# Patient Record
Sex: Female | Born: 1945 | Race: White | Hispanic: No | Marital: Married | Smoking: Never smoker
Health system: Southern US, Community
[De-identification: ages and names within clinical notes are randomized; demographics above are authoritative.]

## PROBLEM LIST (undated history)

## (undated) DIAGNOSIS — M48061 Spinal stenosis, lumbar region without neurogenic claudication: Secondary | ICD-10-CM

## (undated) DIAGNOSIS — M5136 Other intervertebral disc degeneration, lumbar region: Secondary | ICD-10-CM

## (undated) DIAGNOSIS — M51369 Other intervertebral disc degeneration, lumbar region without mention of lumbar back pain or lower extremity pain: Secondary | ICD-10-CM

## (undated) DIAGNOSIS — M5416 Radiculopathy, lumbar region: Secondary | ICD-10-CM

## (undated) HISTORY — PX: BREAST BIOPSY: SHX20

## (undated) HISTORY — PX: BREAST EXCISIONAL BIOPSY: SUR124

## (undated) HISTORY — PX: ABDOMINAL HYSTERECTOMY: SHX81

## (undated) HISTORY — PX: BREAST CYST ASPIRATION: SHX578

---

## 2003-05-17 HISTORY — PX: BREAST BIOPSY: SHX20

## 2004-06-26 ENCOUNTER — Ambulatory Visit: Payer: Self-pay | Admitting: Internal Medicine

## 2004-09-25 ENCOUNTER — Ambulatory Visit: Payer: Self-pay | Admitting: Internal Medicine

## 2004-10-10 ENCOUNTER — Ambulatory Visit: Payer: Self-pay | Admitting: Internal Medicine

## 2005-08-14 ENCOUNTER — Ambulatory Visit: Payer: Self-pay | Admitting: Internal Medicine

## 2006-06-05 ENCOUNTER — Ambulatory Visit: Payer: Self-pay | Admitting: Internal Medicine

## 2006-07-01 ENCOUNTER — Ambulatory Visit: Payer: Self-pay | Admitting: Internal Medicine

## 2007-07-27 ENCOUNTER — Ambulatory Visit: Payer: Self-pay | Admitting: Family Medicine

## 2008-07-27 ENCOUNTER — Ambulatory Visit: Payer: Self-pay | Admitting: Family Medicine

## 2009-07-30 ENCOUNTER — Ambulatory Visit: Payer: Self-pay | Admitting: Unknown Physician Specialty

## 2010-08-01 ENCOUNTER — Ambulatory Visit: Payer: Self-pay | Admitting: Unknown Physician Specialty

## 2011-08-05 ENCOUNTER — Ambulatory Visit: Payer: Self-pay

## 2011-09-18 ENCOUNTER — Ambulatory Visit: Payer: Self-pay | Admitting: Unknown Physician Specialty

## 2011-09-19 LAB — PATHOLOGY REPORT

## 2012-08-06 ENCOUNTER — Ambulatory Visit: Payer: Self-pay

## 2012-09-14 ENCOUNTER — Ambulatory Visit: Payer: Self-pay

## 2012-09-14 LAB — COMPREHENSIVE METABOLIC PANEL
Alkaline Phosphatase: 74 U/L (ref 50–136)
BUN: 20 mg/dL — ABNORMAL HIGH (ref 7–18)
Chloride: 96 mmol/L — ABNORMAL LOW (ref 98–107)
EGFR (African American): >60
EGFR (Non-African Amer.): >60
Glucose: 110 mg/dL — ABNORMAL HIGH (ref 65–99)
Osmolality: 271 (ref 275–301)
Potassium: 3.6 mmol/L (ref 3.5–5.1)
SGOT(AST): 19 U/L (ref 15–37)
SGPT (ALT): 31 U/L (ref 12–78)
Total Protein: 7.4 g/dL (ref 6.4–8.2)

## 2012-09-14 LAB — CBC WITH DIFFERENTIAL/PLATELET
Basophil #: 0 10*3/uL (ref 0.0–0.1)
Eosinophil #: 0 10*3/uL (ref 0.0–0.7)
HCT: 42.1 % (ref 35.0–47.0)
HGB: 14.4 g/dL (ref 12.0–16.0)
Lymphocyte #: 0.2 10*3/uL — ABNORMAL LOW (ref 1.0–3.6)
Lymphocyte %: 3 %
MCH: 31.5 pg (ref 26.0–34.0)
MCV: 92 fL (ref 80–100)
Monocyte #: 0.2 x10 3/mm (ref 0.2–0.9)
Neutrophil %: 93.8 %
Platelet: 259 10*3/uL (ref 150–440)
RBC: 4.58 10*6/uL (ref 3.80–5.20)

## 2012-09-14 LAB — URINALYSIS, COMPLETE
Ketone: NEGATIVE
Ph: 6 (ref 4.5–8.0)
Specific Gravity: 1.025 (ref 1.003–1.030)

## 2012-11-16 ENCOUNTER — Ambulatory Visit: Payer: Self-pay | Admitting: Unknown Physician Specialty

## 2013-09-07 ENCOUNTER — Ambulatory Visit: Payer: Self-pay | Admitting: Family Medicine

## 2014-09-12 ENCOUNTER — Other Ambulatory Visit: Payer: Self-pay | Admitting: Family Medicine

## 2014-09-12 DIAGNOSIS — Z1231 Encounter for screening mammogram for malignant neoplasm of breast: Secondary | ICD-10-CM

## 2014-09-27 ENCOUNTER — Ambulatory Visit
Admission: RE | Admit: 2014-09-27 | Discharge: 2014-09-27 | Disposition: A | Discharge status: Home / Self Care | Payer: Medicare Other | Source: Ambulatory Visit | Attending: Family Medicine | Admitting: Family Medicine

## 2014-09-27 DIAGNOSIS — Z1231 Encounter for screening mammogram for malignant neoplasm of breast: Secondary | ICD-10-CM | POA: Insufficient documentation

## 2015-01-27 ENCOUNTER — Encounter: Payer: Self-pay | Admitting: *Deleted

## 2015-01-27 ENCOUNTER — Ambulatory Visit
Admission: EM | Admit: 2015-01-27 | Discharge: 2015-01-27 | Disposition: A | Discharge status: Home / Self Care | Payer: Medicare Other | Attending: Family Medicine | Admitting: Family Medicine

## 2015-01-27 DIAGNOSIS — M545 Low back pain: Secondary | ICD-10-CM | POA: Insufficient documentation

## 2015-01-27 DIAGNOSIS — Z79899 Other long term (current) drug therapy: Secondary | ICD-10-CM | POA: Diagnosis not present

## 2015-01-27 DIAGNOSIS — M62830 Muscle spasm of back: Secondary | ICD-10-CM | POA: Diagnosis not present

## 2015-01-27 DIAGNOSIS — G8929 Other chronic pain: Secondary | ICD-10-CM | POA: Insufficient documentation

## 2015-01-27 DIAGNOSIS — M5136 Other intervertebral disc degeneration, lumbar region: Secondary | ICD-10-CM | POA: Diagnosis not present

## 2015-01-27 DIAGNOSIS — M4806 Spinal stenosis, lumbar region: Secondary | ICD-10-CM | POA: Diagnosis not present

## 2015-01-27 DIAGNOSIS — M5416 Radiculopathy, lumbar region: Secondary | ICD-10-CM | POA: Insufficient documentation

## 2015-01-27 DIAGNOSIS — Z7982 Long term (current) use of aspirin: Secondary | ICD-10-CM | POA: Insufficient documentation

## 2015-01-27 HISTORY — DX: Radiculopathy, lumbar region: M54.16

## 2015-01-27 HISTORY — DX: Other intervertebral disc degeneration, lumbar region without mention of lumbar back pain or lower extremity pain: M51.369

## 2015-01-27 HISTORY — DX: Spinal stenosis, lumbar region without neurogenic claudication: M48.061

## 2015-01-27 HISTORY — DX: Other intervertebral disc degeneration, lumbar region: M51.36

## 2015-01-27 LAB — URINALYSIS COMPLETE WITH MICROSCOPIC (ARMC ONLY)
Bacteria, UA: NONE SEEN — AB
Bilirubin Urine: NEGATIVE
Glucose, UA: NEGATIVE mg/dL
Hgb urine dipstick: NEGATIVE
Ketones, ur: NEGATIVE mg/dL
Leukocytes, UA: NEGATIVE
Nitrite: NEGATIVE
Protein, ur: NEGATIVE mg/dL
Specific Gravity, Urine: 1.015 (ref 1.005–1.030)
WBC, UA: NONE SEEN WBC/hpf (ref <3)
pH: 7 (ref 5.0–8.0)

## 2015-01-27 MED ORDER — MELOXICAM 7.5 MG PO TABS: 7.5000 mg | ORAL_TABLET | Freq: Every day | ORAL | Status: AC

## 2015-01-27 MED ORDER — METAXALONE 800 MG PO TABS: 800.0000 mg | ORAL_TABLET | Freq: Three times a day (TID) | ORAL | Status: AC

## 2015-01-27 MED ORDER — KETOROLAC TROMETHAMINE 60 MG/2ML IM SOLN
30.0000 mg | Freq: Once | INTRAMUSCULAR | Status: AC
  Administered 2015-01-27: 30 mg via INTRAMUSCULAR

## 2015-01-27 NOTE — ED Provider Notes (Signed)
CSN: [id number]     Arrival date & time 01/27/15  4098 History   First MD Initiated Contact with Patient 01/27/15 1030     Chief Complaint  Patient presents with  . Back Pain   (Consider location/radiation/quality/duration/timing/severity/associated sxs/prior Treatment) HPI   This 69 year old female who presents with chronic low back pain she normally feels in the lower segments. She has had several epidural steroid injections which provide her with a great deal of relief from 4 months up to 8 months. She states that she knows that she needs to have surgery in the future but is waiting, at her surgeon's insistence, until she cannot stand it any longer. The last couple of days she has noticed the onset of left-sided low back pain which is more intense and different location than what she normally experiences. This is more towards the flank does not radiate when she will normally get on occasion down her left leg. Last night it was so severe that she decided to come in today. She has an appointment with Dr. Yves Dill next week but decided she can't wait. She does have a prescription for hydrocodone that she takes once nightly does help her sleep. He denies any urinary symptoms.  Past Medical History  Diagnosis Date  . Lumbar radiculitis   . Lumbar stenosis without neurogenic claudication   . Degenerative disc disease, lumbar    Past Surgical History  Procedure Laterality Date  . Breast biopsy Left     neg  . Breast biopsy Right 05/17/03    neg  . Breast cyst aspiration      not sure of side 1998-neg  . Abdominal hysterectomy     Family History  Problem Relation Age of Onset  . Breast cancer Sister 47  . Breast cancer Paternal Aunt    Social History  Substance Use Topics  . Smoking status: Never Smoker   . Smokeless tobacco: None  . Alcohol Use: No   OB History    No data available     Review of Systems  Constitutional: Negative for fever, chills, fatigue and unexpected weight  change.  Musculoskeletal: Positive for myalgias and back pain.  All other systems reviewed and are negative.   Allergies  Levaquin and Tramadol  Home Medications   Prior to Admission medications   Medication Sig Start Date End Date Taking? Authorizing Provider  amLODipine (NORVASC) 5 MG tablet Take 5 mg by mouth daily.   Yes Historical Provider, MD  aspirin 81 MG tablet Take 81 mg by mouth daily.   Yes Historical Provider, MD  Calcium Carbonate (CALCIUM 600 PO) Take by mouth.   Yes Historical Provider, MD  Cholecalciferol (VITAMIN D3) 1000 UNITS CAPS Take by mouth.   Yes Historical Provider, MD  fluticasone (VERAMYST) 27.5 MCG/SPRAY nasal spray Place 2 sprays into the nose daily.   Yes Historical Provider, MD  HYDROcodone-acetaminophen (NORCO/VICODIN) 5-325 MG per tablet Take 1 tablet by mouth daily.   Yes Historical Provider, MD  ibuprofen (ADVIL,MOTRIN) 800 MG tablet Take 800 mg by mouth every 8 (eight) hours as needed.   Yes Historical Provider, MD  lovastatin (MEVACOR) 10 MG tablet Take 10 mg by mouth at bedtime.   Yes Historical Provider, MD  Multiple Vitamin (MULTIVITAMIN) capsule Take 1 capsule by mouth daily.   Yes Historical Provider, MD  Omega-3 Fatty Acids (OMEGA 3 PO) Take by mouth.   Yes Historical Provider, MD  meloxicam (MOBIC) 7.5 MG tablet Take 1 tablet (7.5 mg total) by  mouth daily. 01/27/15   Lutricia Feil, PA-C  metaxalone (SKELAXIN) 800 MG tablet Take 1 tablet (800 mg total) by mouth 3 (three) times daily. 01/27/15   Lutricia Feil, PA-C   Meds Ordered and Administered this Visit   Medications  ketorolac (TORADOL) injection 30 mg (30 mg Intramuscular Given 01/27/15 1136)    BP 137/83 mmHg  Pulse 83  Temp(Src) 97.9 F (36.6 C) (Oral)  Ht  (1.651 m)  Wt 170 lb (77.111 kg)  BMI 28.29 kg/m2  SpO2 97% No data found.   Physical Exam  Constitutional: She is oriented to person, place, and time. She appears well-developed and well-nourished. No distress.   HENT:  Head: Normocephalic and atraumatic.  Eyes: Pupils are equal, round, and reactive to light.  Neck: Normal range of motion. Neck supple.  Musculoskeletal:  Examination of the back was on the presence of Erin, nursing student who acted as Biomedical engineer. Examination of the lumbar spine shows a level pelvis in stance. Forward flexion extension and bilateral lateral flexion is comfortable. thoracic rotation particular to the left reproduces her pain. Is no radicular symptoms elicited. She is able to toe and heel walk. Sensation distally was intact to light touch. DTRs are 3+ over 4 and bilaterally symmetrical. Straight leg raise testing is negative at 90 in the sitting position bilaterally. With the patient prone deep palpation in the left paraspinous muscles from T11-L1 reproduces her symptoms. Right paraspinous muscles are nontender and soft. The patient in the right does not reproduce symptoms. She has been a negative CVA tenderness. Urinalysis was benign with no red or white cells present.  Neurological: She is alert and oriented to person, place, and time.  Skin: Skin is warm and dry. She is not diaphoretic.  Psychiatric: She has a normal mood and affect. Her behavior is normal. Judgment and thought content normal.  Nursing note and vitals reviewed.   ED Course  Procedures (including critical care time)  Labs Review Labs Reviewed  URINALYSIS COMPLETEWITH MICROSCOPIC ([facility] ONLY) - Abnormal; Notable for the following:    Bacteria, UA NONE SEEN (*)    Squamous Epithelial / LPF 0-5 (*)    All other components within normal limits    Imaging Review No results found.   Visual Acuity Review  Right Eye Distance:   Left Eye Distance:   Bilateral Distance:    Right Eye Near:   Left Eye Near:    Bilateral Near:     Medications  ketorolac (TORADOL) injection 30 mg (30 mg Intramuscular Given 01/27/15 1136)      MDM   1. Spasm of thoracolumbar muscle    New Prescriptions    MELOXICAM (MOBIC) 7.5 MG TABLET    Take 1 tablet (7.5 mg total) by mouth daily.   METAXALONE (SKELAXIN) 800 MG TABLET    Take 1 tablet (800 mg total) by mouth 3 (three) times daily.  Plan: 1. Test/x-ray results and diagnosis reviewed with patient 2. rx as per orders; risks, benefits, potential side effects reviewed with patient 3. Recommend supportive treatment with rest,symptom avoidance. Cautioned re: dizziness with use of muscle relaxers.   4. F/uwith PCP or spinal surgeon.    Lutricia Feil, PA-C 01/27/15 1143

## 2015-01-27 NOTE — ED Notes (Signed)
Pt states that she has had left sided back pain on and off for about 2 years, she has received 2 epidural steroid injections, last one in April, Pt is scheduled to see Dr Yves Dill in 2 weeks.  Pain is too bad to wait for the upcoming appointment

## 2015-10-17 ENCOUNTER — Other Ambulatory Visit: Payer: Self-pay | Admitting: Family Medicine

## 2015-10-17 DIAGNOSIS — Z1231 Encounter for screening mammogram for malignant neoplasm of breast: Secondary | ICD-10-CM

## 2015-10-23 ENCOUNTER — Ambulatory Visit
Admission: RE | Admit: 2015-10-23 | Discharge: 2015-10-23 | Disposition: A | Discharge status: Home / Self Care | Payer: Medicare Other | Source: Ambulatory Visit | Attending: Family Medicine | Admitting: Family Medicine

## 2015-10-23 DIAGNOSIS — Z1231 Encounter for screening mammogram for malignant neoplasm of breast: Secondary | ICD-10-CM | POA: Insufficient documentation

## 2016-09-15 ENCOUNTER — Other Ambulatory Visit: Payer: Self-pay | Admitting: Family Medicine

## 2016-09-15 DIAGNOSIS — Z1231 Encounter for screening mammogram for malignant neoplasm of breast: Secondary | ICD-10-CM

## 2016-10-23 ENCOUNTER — Other Ambulatory Visit: Payer: Self-pay | Admitting: Family Medicine

## 2016-10-23 ENCOUNTER — Ambulatory Visit
Admission: RE | Admit: 2016-10-23 | Discharge: 2016-10-23 | Disposition: A | Discharge status: Home / Self Care | Payer: Medicare Other | Source: Ambulatory Visit | Attending: Family Medicine | Admitting: Family Medicine

## 2016-10-23 DIAGNOSIS — Z1231 Encounter for screening mammogram for malignant neoplasm of breast: Secondary | ICD-10-CM

## 2017-10-02 ENCOUNTER — Other Ambulatory Visit: Payer: Self-pay | Admitting: Nurse Practitioner

## 2017-10-02 DIAGNOSIS — Z1231 Encounter for screening mammogram for malignant neoplasm of breast: Secondary | ICD-10-CM

## 2017-10-26 ENCOUNTER — Ambulatory Visit: Payer: Medicare Other

## 2018-01-12 ENCOUNTER — Ambulatory Visit
Admission: RE | Admit: 2018-01-12 | Discharge: 2018-01-12 | Disposition: A | Discharge status: Home / Self Care | Payer: Medicare Other | Source: Ambulatory Visit | Attending: Nurse Practitioner | Admitting: Nurse Practitioner

## 2018-01-12 DIAGNOSIS — Z1231 Encounter for screening mammogram for malignant neoplasm of breast: Secondary | ICD-10-CM | POA: Diagnosis present

## 2018-07-21 ENCOUNTER — Other Ambulatory Visit: Payer: Self-pay

## 2018-07-21 ENCOUNTER — Encounter: Payer: Self-pay | Admitting: Emergency Medicine

## 2018-07-21 ENCOUNTER — Ambulatory Visit
Admission: EM | Admit: 2018-07-21 | Discharge: 2018-07-21 | Disposition: A | Discharge status: Home / Self Care | Payer: Medicare Other | Attending: Family Medicine | Admitting: Family Medicine

## 2018-07-21 DIAGNOSIS — S0991XA Unspecified injury of ear, initial encounter: Secondary | ICD-10-CM | POA: Diagnosis not present

## 2018-07-21 DIAGNOSIS — X58XXXA Exposure to other specified factors, initial encounter: Secondary | ICD-10-CM | POA: Diagnosis not present

## 2018-07-21 NOTE — ED Provider Notes (Signed)
[facility]    CSN: [id number] Arrival date & time: 07/21/18  1910     History   Chief Complaint Chief Complaint  Patient presents with  . Q-tip in left ear   HPI  73 year old female presents with concern for foreign body in her left ear.  Patient states that she was cleaning her ears today.  She is active in her ear and he noted that he did not have the cotton tip on it.  She is concerned that she has the cotton stuck in her left ear.  No reports of drainage from the ear.  No reports of hearing loss.  No other reported symptoms. No other complaints.   PMH, Surgical Hx, Family Hx, Social History reviewed and updated as below.  Past Medical History:  Diagnosis Date  . Degenerative disc disease, lumbar   . Lumbar radiculitis   . Lumbar stenosis without neurogenic claudication    Past Surgical History:  Procedure Laterality Date  . ABDOMINAL HYSTERECTOMY    . BREAST BIOPSY Left    neg  . BREAST BIOPSY Right 05/17/03   neg  . BREAST CYST ASPIRATION Left    1998-neg   OB History   No obstetric history on file.    Home Medications    Prior to Admission medications   Medication Sig Start Date End Date Taking? Authorizing Provider  amLODipine (NORVASC) 5 MG tablet Take 5 mg by mouth daily.   Yes [provider]  aspirin 81 MG tablet Take 81 mg by mouth daily.   Yes [provider]  Calcium Carbonate (CALCIUM 600 PO) Take by mouth.   Yes [provider]  Cetirizine HCl 10 MG CAPS Take by mouth.   Yes [provider]  Cholecalciferol (VITAMIN D3) 1000 UNITS CAPS Take by mouth.   Yes [provider]  docusate sodium (COLACE) 100 MG capsule Take by mouth.   Yes [provider]  fluticasone (VERAMYST) 27.5 MCG/SPRAY nasal spray Place 2 sprays into the nose daily.   Yes [provider]  HYDROcodone-acetaminophen (NORCO/VICODIN) 5-325 MG per tablet Take 1 tablet by mouth daily.   Yes [provider]  ibuprofen (ADVIL,MOTRIN) 800 MG tablet Take 800 mg by mouth every 8 (eight) hours as needed.   Yes [provider]  lovastatin (MEVACOR) 10 MG tablet Take 10 mg by mouth at bedtime.   Yes [provider]  Melatonin 10 MG CAPS Take by mouth.   Yes [provider]  Multiple Vitamin (MULTIVITAMIN) capsule Take 1 capsule by mouth daily.   Yes [provider]  Omega-3 Fatty Acids (OMEGA 3 PO) Take by mouth.   Yes [provider]  traZODone (DESYREL) 100 MG tablet Take by mouth. 06/14/18  Yes [provider]  meloxicam (MOBIC) 7.5 MG tablet Take 1 tablet (7.5 mg total) by mouth daily. 01/27/15   Lutricia Feil, PA-C  metaxalone (SKELAXIN) 800 MG tablet Take 1 tablet (800 mg total) by mouth 3 (three) times daily. 01/27/15   Lutricia Feil, PA-C    Family History Family History  Problem Relation Age of Onset  . Breast cancer Sister 89  . Breast cancer Paternal Aunt   . Breast cancer Paternal Aunt   . Dementia Mother   . Hypertension Mother   . Heart attack Father 72    Social History Social History   Tobacco Use  . Smoking status: Never Smoker  . Smokeless tobacco: Never Used  Substance Use Topics  .  Alcohol use: No  . Drug use: No     Allergies   Levaquin [levofloxacin] and Tramadol   Review of Systems Review of Systems  Constitutional: Negative.   HENT:       Concern for foreign body in left ear.   Physical Exam Triage Vital Signs ED Triage Vitals  Enc Vitals Group     BP 07/21/18 1944 (!) 165/89     Pulse Rate 07/21/18 1944 73     Resp 07/21/18 1944 16     Temp 07/21/18 1944 98.3 F (36.8 C)     Temp Source 07/21/18 1944 Oral     SpO2 07/21/18 1944 97 %     Weight 07/21/18 1943 150 lb (68 kg)     Height 07/21/18 1943  (1.651 m)     Head Circumference --      Peak Flow --      Pain Score 07/21/18 1943 0     Pain Loc --      Pain Edu? --      Excl. in GC? --    Updated Vital Signs BP (!) 165/89  (BP Location: Left Arm)   Pulse 73   Temp 98.3 F (36.8 C) (Oral)   Resp 16   Ht  (1.651 m)   Wt 68 kg   SpO2 97%   BMI 24.96 kg/m   Visual Acuity Right Eye Distance:   Left Eye Distance:   Bilateral Distance:    Right Eye Near:   Left Eye Near:    Bilateral Near:     Physical Exam Vitals signs and nursing note reviewed.  Constitutional:      General: She is not in acute distress.    Appearance: Normal appearance.  HENT:     Head: Normocephalic and atraumatic.     Ears:     Comments: Left ear - No foreign body. Area of blood noted from injury due to Q Tip. Eyes:     General:        Right eye: No discharge.        Left eye: No discharge.     Conjunctiva/sclera: Conjunctivae normal.  Pulmonary:     Effort: Pulmonary effort is normal. No respiratory distress.  Neurological:     Mental Status: She is alert.  Psychiatric:        Mood and Affect: Mood normal.        Behavior: Behavior normal.    UC Treatments / Results  Labs (all labs ordered are listed, but only abnormal results are displayed) Labs Reviewed - No data to display  EKG None  Radiology No results found.  Procedures Procedures (including critical care time)  Medications Ordered in UC Medications - No data to display  Initial Impression / Assessment and Plan / UC Course  I have reviewed the triage vital signs and the nursing notes.  Pertinent labs & imaging results that were available during my care of the patient were reviewed by me and considered in my medical decision making (see chart for details).    73 year old female presents with a concern for foreign body in her left ear.  There was evidence of mild injury due to use of Q-tip.  No foreign body appreciated.  Advised supportive care.  Advised to discontinue use of Q-tips.  Final Clinical Impressions(s) / UC Diagnoses   Final diagnoses:  Injury of ear canal, initial encounter   Discharge Instructions   None    ED  Prescriptions    None     Controlled Substance Prescriptions [place] Controlled Substance Registry consulted? Not Applicable   Tommie Sams, DO 07/21/18 2052

## 2018-07-21 NOTE — ED Triage Notes (Signed)
Patient in today stating that she put a Q-tip in her left ear tonight and when she removed it the cotton was not on the tip.

## 2018-12-13 ENCOUNTER — Other Ambulatory Visit: Payer: Self-pay | Admitting: Nurse Practitioner

## 2018-12-13 DIAGNOSIS — Z1231 Encounter for screening mammogram for malignant neoplasm of breast: Secondary | ICD-10-CM

## 2018-12-20 ENCOUNTER — Other Ambulatory Visit: Payer: Self-pay | Admitting: Nurse Practitioner

## 2018-12-20 ENCOUNTER — Other Ambulatory Visit: Payer: Self-pay | Admitting: Family Medicine

## 2018-12-20 DIAGNOSIS — Z1231 Encounter for screening mammogram for malignant neoplasm of breast: Secondary | ICD-10-CM

## 2019-01-18 ENCOUNTER — Other Ambulatory Visit: Payer: Self-pay

## 2019-01-18 ENCOUNTER — Ambulatory Visit
Admission: RE | Admit: 2019-01-18 | Discharge: 2019-01-18 | Disposition: A | Discharge status: Home / Self Care | Payer: Medicare Other | Source: Ambulatory Visit | Attending: Nurse Practitioner | Admitting: Nurse Practitioner

## 2019-01-18 DIAGNOSIS — Z1231 Encounter for screening mammogram for malignant neoplasm of breast: Secondary | ICD-10-CM | POA: Diagnosis not present

## 2020-01-18 ENCOUNTER — Other Ambulatory Visit: Payer: Self-pay | Admitting: Nurse Practitioner

## 2020-01-18 DIAGNOSIS — Z1231 Encounter for screening mammogram for malignant neoplasm of breast: Secondary | ICD-10-CM

## 2020-02-01 ENCOUNTER — Ambulatory Visit
Admission: RE | Admit: 2020-02-01 | Discharge: 2020-02-01 | Disposition: A | Discharge status: Home / Self Care | Payer: Medicare Other | Source: Ambulatory Visit | Attending: Nurse Practitioner | Admitting: Nurse Practitioner

## 2020-02-01 ENCOUNTER — Other Ambulatory Visit: Payer: Self-pay

## 2020-02-01 DIAGNOSIS — Z1231 Encounter for screening mammogram for malignant neoplasm of breast: Secondary | ICD-10-CM | POA: Diagnosis not present

## 2021-01-28 ENCOUNTER — Other Ambulatory Visit: Payer: Self-pay | Admitting: Nurse Practitioner

## 2021-01-28 DIAGNOSIS — Z1231 Encounter for screening mammogram for malignant neoplasm of breast: Secondary | ICD-10-CM

## 2021-02-13 ENCOUNTER — Ambulatory Visit
Admission: RE | Admit: 2021-02-13 | Discharge: 2021-02-13 | Disposition: A | Discharge status: Home / Self Care | Payer: Medicare Other | Source: Ambulatory Visit | Attending: Nurse Practitioner | Admitting: Nurse Practitioner

## 2021-02-13 ENCOUNTER — Other Ambulatory Visit: Payer: Self-pay

## 2021-02-13 DIAGNOSIS — Z1231 Encounter for screening mammogram for malignant neoplasm of breast: Secondary | ICD-10-CM | POA: Insufficient documentation

## 2021-11-30 IMAGING — MG MM DIGITAL SCREENING BILAT W/ TOMO AND CAD
8 series · 8 of 24 positions shown · non-contrast
Comparison: Previous exam(s).

CLINICAL DATA: Screening.

EXAM:
DIGITAL SCREENING BILATERAL MAMMOGRAM WITH TOMOSYNTHESIS AND CAD
TECHNIQUE: Bilateral screening digital craniocaudal and mediolateral oblique
mammograms were obtained. Bilateral screening digital breast
tomosynthesis was performed. The images were evaluated with
computer-aided detection.

[R CC synth-2D]
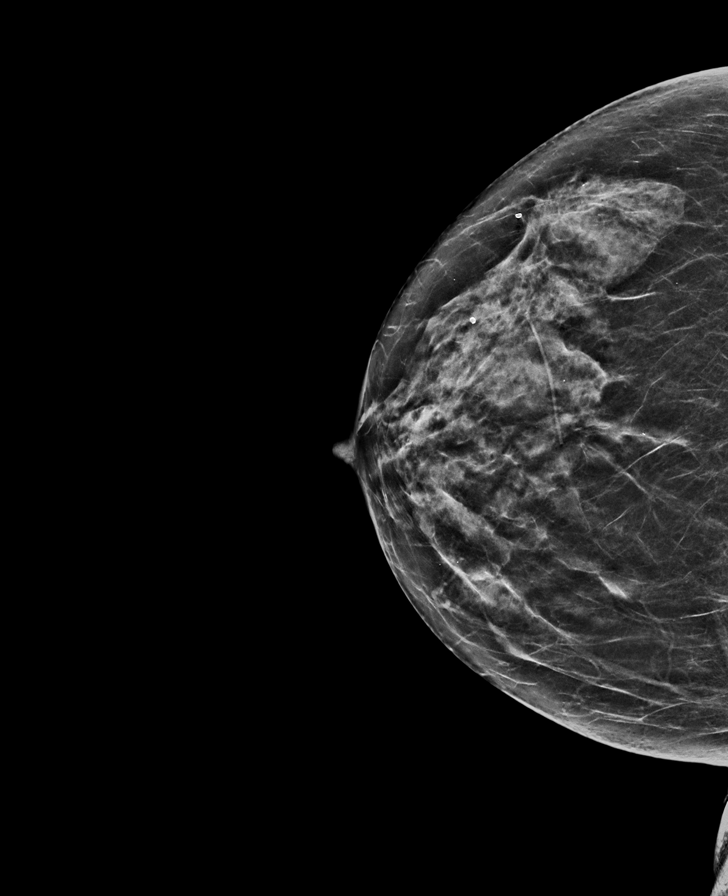

[R MLO synth-2D]
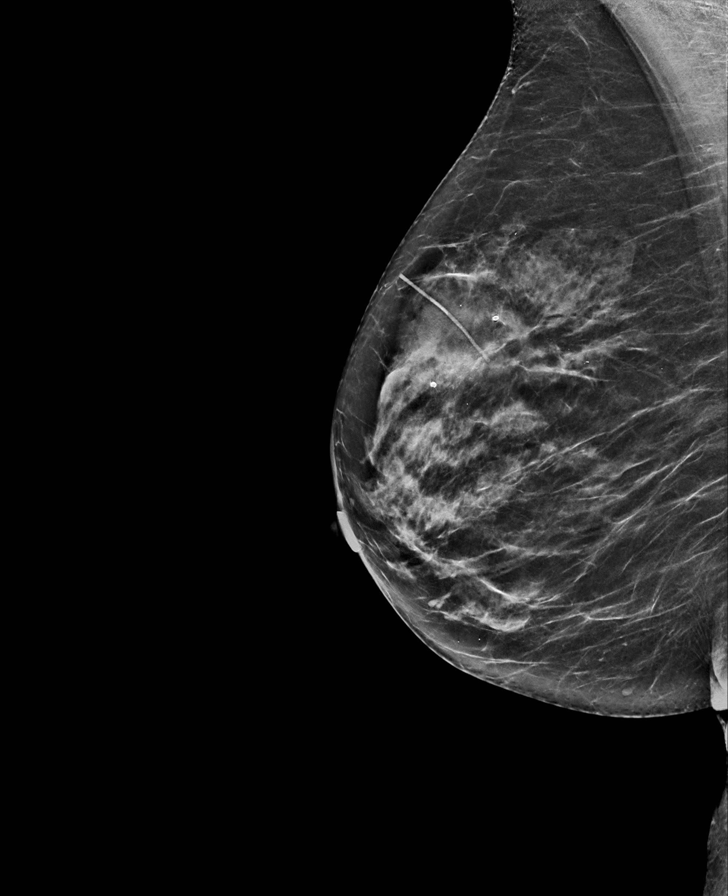

[L MLO synth-2D]
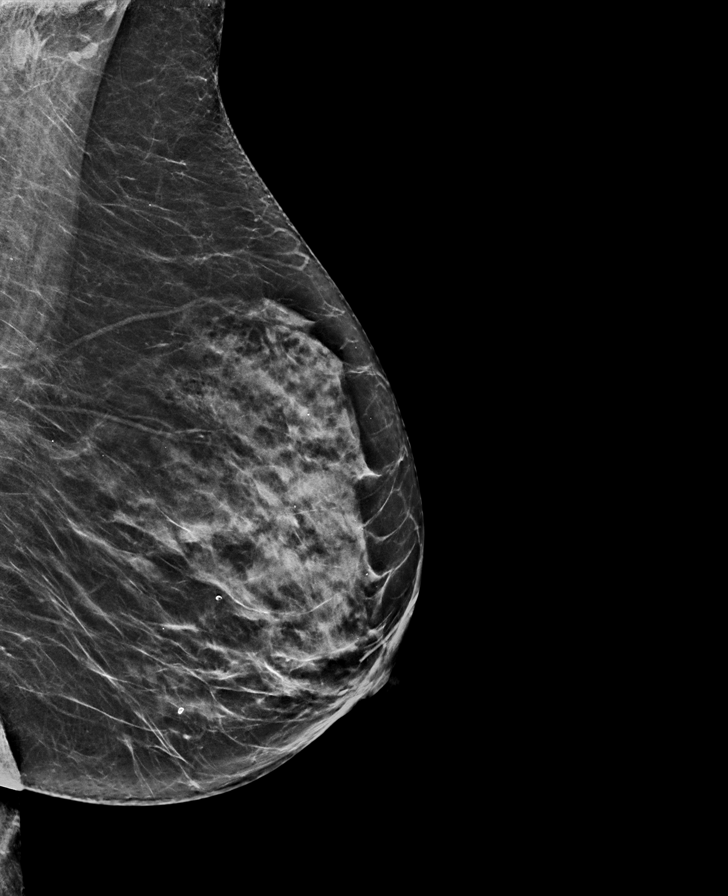

[L CC synth-2D]
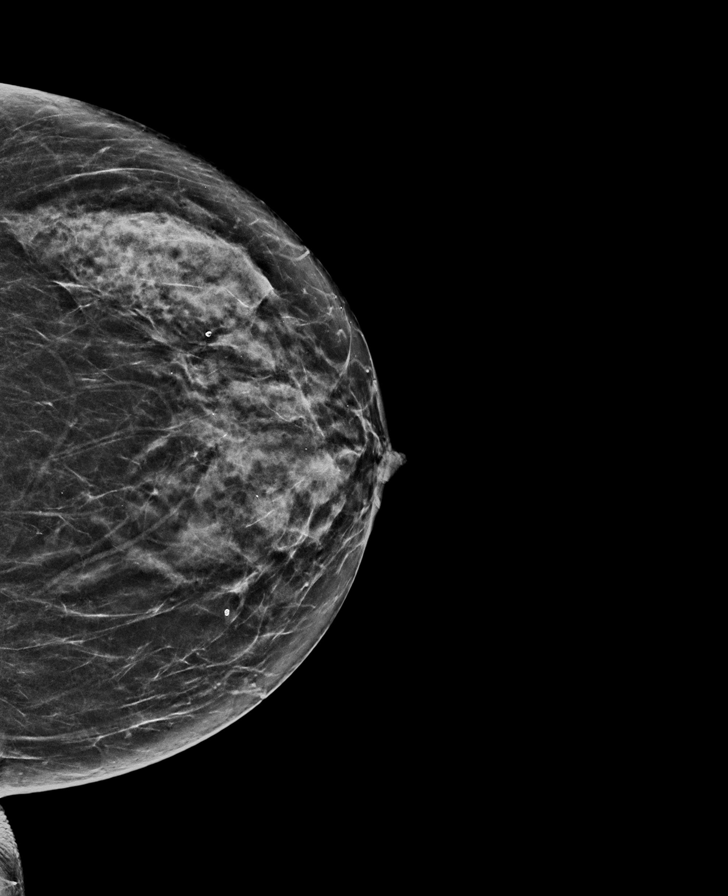

[L CC tomo · tomo slice 30/59.0]
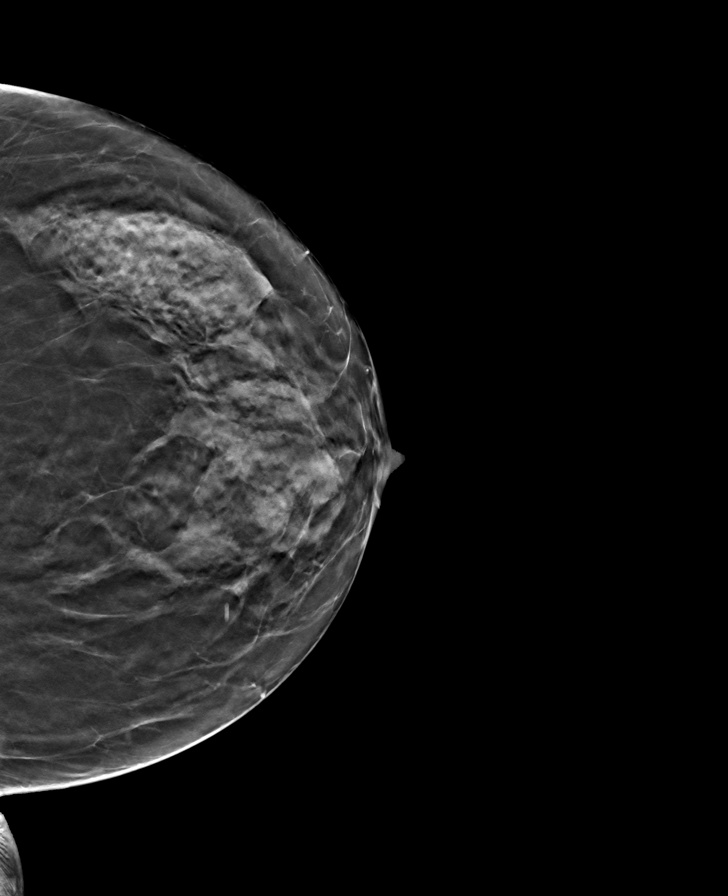

[R MLO tomo · tomo slice 32/63.0]
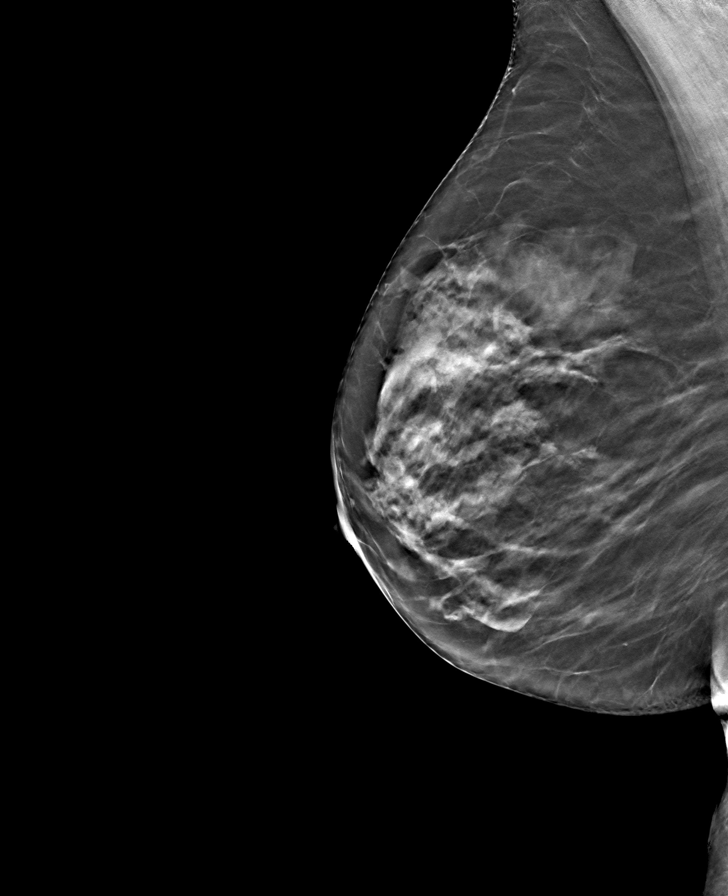

[R CC tomo · tomo slice 31/60.0]
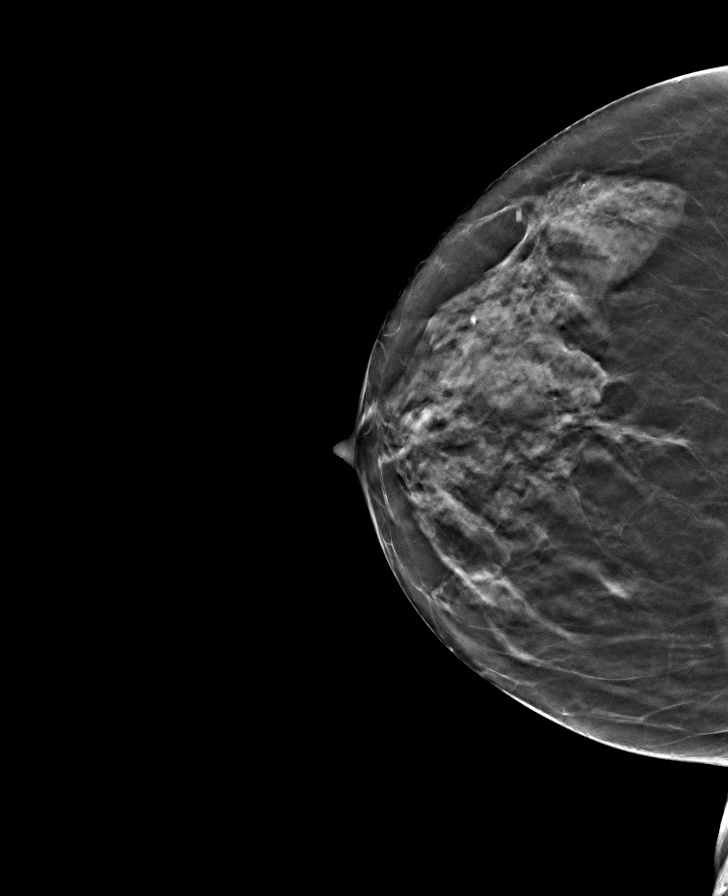

[L MLO tomo · tomo slice 33/65.0]
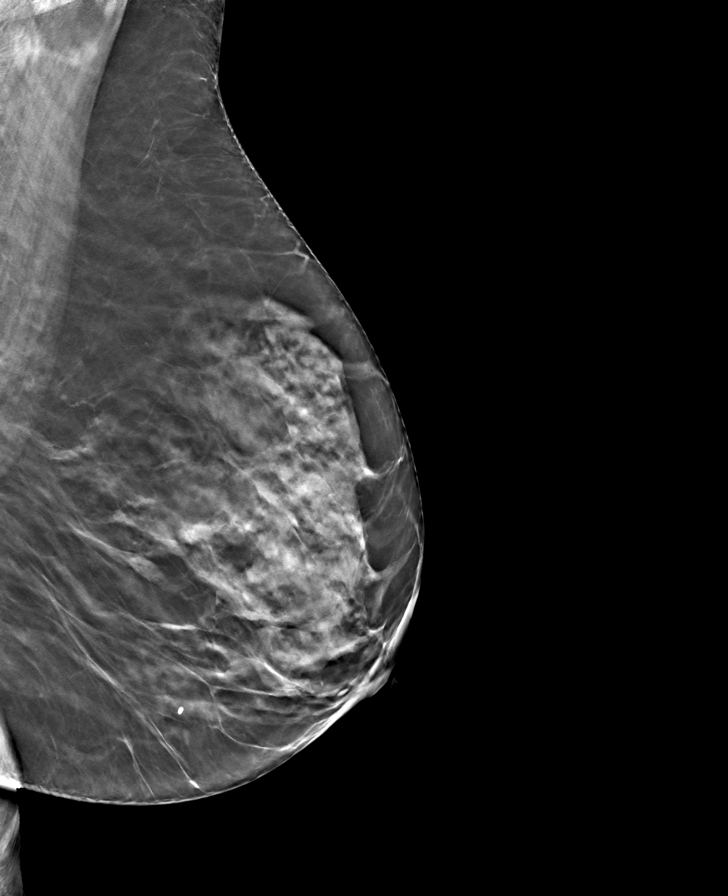

[8 of 24 positions shown; findings below may reference images not displayed]

ACR Breast Density Category c: The breast tissue is heterogeneously
dense, which may obscure small masses.
FINDINGS: There are no findings suspicious for malignancy.
IMPRESSION: No mammographic evidence of malignancy. A result letter of this
screening mammogram will be mailed directly to the patient.

RECOMMENDATION:
Screening mammogram in one year. (Code:Q3-W-BC3)

BI-RADS CATEGORY  1: Negative.

## 2022-01-23 ENCOUNTER — Other Ambulatory Visit: Payer: Self-pay | Admitting: Nurse Practitioner

## 2022-01-23 DIAGNOSIS — Z1231 Encounter for screening mammogram for malignant neoplasm of breast: Secondary | ICD-10-CM

## 2022-02-17 ENCOUNTER — Ambulatory Visit
Admission: RE | Admit: 2022-02-17 | Discharge: 2022-02-17 | Disposition: A | Discharge status: Home / Self Care | Payer: Medicare Other | Source: Ambulatory Visit | Attending: Nurse Practitioner | Admitting: Nurse Practitioner

## 2022-02-17 DIAGNOSIS — Z1231 Encounter for screening mammogram for malignant neoplasm of breast: Secondary | ICD-10-CM | POA: Diagnosis present

## 2023-01-22 ENCOUNTER — Encounter: Payer: Self-pay | Admitting: Nurse Practitioner

## 2023-01-22 ENCOUNTER — Other Ambulatory Visit: Payer: Self-pay | Admitting: Nurse Practitioner

## 2023-01-22 DIAGNOSIS — Z1231 Encounter for screening mammogram for malignant neoplasm of breast: Secondary | ICD-10-CM

## 2023-02-19 ENCOUNTER — Ambulatory Visit
Admission: RE | Admit: 2023-02-19 | Discharge: 2023-02-19 | Disposition: A | Discharge status: Home / Self Care | Payer: Medicare Other | Source: Ambulatory Visit | Attending: Nurse Practitioner | Admitting: Nurse Practitioner

## 2023-02-19 DIAGNOSIS — Z1231 Encounter for screening mammogram for malignant neoplasm of breast: Secondary | ICD-10-CM | POA: Insufficient documentation

## 2024-01-22 ENCOUNTER — Other Ambulatory Visit: Payer: Self-pay | Admitting: Nurse Practitioner

## 2024-01-22 ENCOUNTER — Encounter: Payer: Self-pay | Admitting: Nurse Practitioner

## 2024-01-22 DIAGNOSIS — Z1231 Encounter for screening mammogram for malignant neoplasm of breast: Secondary | ICD-10-CM

## 2024-02-22 ENCOUNTER — Ambulatory Visit

## 2024-02-23 ENCOUNTER — Ambulatory Visit
